# Patient Record
Sex: Female | Born: 1995 | Hispanic: Yes | Marital: Married | Smoking: Never smoker
Health system: Southern US, Community
[De-identification: ages and names within clinical notes are randomized; demographics above are authoritative.]

## PROBLEM LIST (undated history)

## (undated) DIAGNOSIS — Z789 Other specified health status: Secondary | ICD-10-CM

## (undated) HISTORY — PX: NO PAST SURGERIES: SHX2092

---

## 2014-12-04 NOTE — L&D Delivery Note (Cosign Needed)
Delivery Note At 8:28 AM a viable female was delivered via Vaginal, Spontaneous Delivery (Presentation: Left Occiput Transverse) with nuchal cord x 1 loose,occult left arm and true knot in cord.  APGAR:9 ,9 ; weight  .   Placenta status: Intact, Spontaneous.  Cord: 3 vessels with the following complications: true Knot.  Cord pH: n/a  Anesthesia: None  Episiotomy: None Lacerations: None Suture Repair: none Est. Blood Loss (mL): 300  Mom to postpartum.  Baby to Couplet care / Skin to Skin.  LAWSON, MARIE DARLENE 09/05/2015, 8:40 AM

## 2015-06-02 LAB — OB RESULTS CONSOLE GBS: GBS: NEGATIVE

## 2015-06-03 ENCOUNTER — Other Ambulatory Visit (HOSPITAL_COMMUNITY): Payer: Self-pay | Admitting: Physician Assistant

## 2015-06-03 DIAGNOSIS — O0933 Supervision of pregnancy with insufficient antenatal care, third trimester: Secondary | ICD-10-CM

## 2015-06-03 DIAGNOSIS — Z0489 Encounter for examination and observation for other specified reasons: Secondary | ICD-10-CM

## 2015-06-03 DIAGNOSIS — IMO0002 Reserved for concepts with insufficient information to code with codable children: Secondary | ICD-10-CM

## 2015-06-03 LAB — OB RESULTS CONSOLE ANTIBODY SCREEN: Antibody Screen: NEGATIVE

## 2015-06-03 LAB — OB RESULTS CONSOLE GC/CHLAMYDIA
Chlamydia: NEGATIVE
Gonorrhea: NEGATIVE

## 2015-06-03 LAB — OB RESULTS CONSOLE ABO/RH: RH Type: POSITIVE

## 2015-06-03 LAB — OB RESULTS CONSOLE HEPATITIS B SURFACE ANTIGEN: Hepatitis B Surface Ag: NEGATIVE

## 2015-06-03 LAB — OB RESULTS CONSOLE HIV ANTIBODY (ROUTINE TESTING): HIV: NONREACTIVE

## 2015-06-03 LAB — OB RESULTS CONSOLE RPR: RPR: NONREACTIVE

## 2015-06-03 LAB — OB RESULTS CONSOLE RUBELLA ANTIBODY, IGM: Rubella: IMMUNE

## 2015-06-08 ENCOUNTER — Ambulatory Visit (HOSPITAL_COMMUNITY)
Admission: RE | Admit: 2015-06-08 | Discharge: 2015-06-08 | Disposition: A | Discharge status: Home / Self Care | Payer: Self-pay | Source: Ambulatory Visit | Attending: Physician Assistant | Admitting: Physician Assistant

## 2015-06-08 DIAGNOSIS — Z3A30 30 weeks gestation of pregnancy: Secondary | ICD-10-CM | POA: Insufficient documentation

## 2015-06-08 DIAGNOSIS — O0930 Supervision of pregnancy with insufficient antenatal care, unspecified trimester: Secondary | ICD-10-CM | POA: Insufficient documentation

## 2015-06-08 DIAGNOSIS — O0933 Supervision of pregnancy with insufficient antenatal care, third trimester: Secondary | ICD-10-CM | POA: Insufficient documentation

## 2015-06-08 DIAGNOSIS — Z3A28 28 weeks gestation of pregnancy: Secondary | ICD-10-CM | POA: Insufficient documentation

## 2015-06-08 DIAGNOSIS — IMO0002 Reserved for concepts with insufficient information to code with codable children: Secondary | ICD-10-CM

## 2015-06-08 DIAGNOSIS — Z3689 Encounter for other specified antenatal screening: Secondary | ICD-10-CM | POA: Insufficient documentation

## 2015-06-08 DIAGNOSIS — Z36 Encounter for antenatal screening of mother: Secondary | ICD-10-CM | POA: Insufficient documentation

## 2015-06-08 DIAGNOSIS — Z0489 Encounter for examination and observation for other specified reasons: Secondary | ICD-10-CM

## 2015-06-15 ENCOUNTER — Other Ambulatory Visit (HOSPITAL_COMMUNITY): Payer: Self-pay | Admitting: Physician Assistant

## 2015-06-15 DIAGNOSIS — Z0489 Encounter for examination and observation for other specified reasons: Secondary | ICD-10-CM

## 2015-06-15 DIAGNOSIS — IMO0002 Reserved for concepts with insufficient information to code with codable children: Secondary | ICD-10-CM

## 2015-07-06 ENCOUNTER — Ambulatory Visit (HOSPITAL_COMMUNITY): Admission: RE | Admit: 2015-07-06 | Payer: Self-pay | Source: Ambulatory Visit

## 2015-08-31 ENCOUNTER — Other Ambulatory Visit (HOSPITAL_COMMUNITY): Payer: Self-pay | Admitting: Physician Assistant

## 2015-08-31 DIAGNOSIS — O480 Post-term pregnancy: Secondary | ICD-10-CM

## 2015-09-03 ENCOUNTER — Ambulatory Visit (HOSPITAL_COMMUNITY)
Admission: RE | Admit: 2015-09-03 | Discharge: 2015-09-03 | Disposition: A | Discharge status: Home / Self Care | Payer: Self-pay | Source: Ambulatory Visit | Attending: Physician Assistant | Admitting: Physician Assistant

## 2015-09-03 ENCOUNTER — Encounter (HOSPITAL_COMMUNITY): Payer: Self-pay | Admitting: *Deleted

## 2015-09-03 ENCOUNTER — Telehealth (HOSPITAL_COMMUNITY): Payer: Self-pay | Admitting: *Deleted

## 2015-09-03 ENCOUNTER — Other Ambulatory Visit (HOSPITAL_COMMUNITY): Payer: Self-pay | Admitting: Physician Assistant

## 2015-09-03 DIAGNOSIS — Z3A40 40 weeks gestation of pregnancy: Secondary | ICD-10-CM | POA: Insufficient documentation

## 2015-09-03 DIAGNOSIS — O0933 Supervision of pregnancy with insufficient antenatal care, third trimester: Secondary | ICD-10-CM | POA: Insufficient documentation

## 2015-09-03 DIAGNOSIS — O480 Post-term pregnancy: Secondary | ICD-10-CM

## 2015-09-03 LAB — OB RESULTS CONSOLE GBS: GBS: NEGATIVE

## 2015-09-03 NOTE — Telephone Encounter (Signed)
Preadmission screen Interpreter number [phone]

## 2015-09-05 ENCOUNTER — Encounter (HOSPITAL_COMMUNITY): Payer: Self-pay | Admitting: *Deleted

## 2015-09-05 ENCOUNTER — Inpatient Hospital Stay (HOSPITAL_COMMUNITY): Admission: RE | Admit: 2015-09-05 | Payer: No Typology Code available for payment source | Source: Ambulatory Visit

## 2015-09-05 ENCOUNTER — Inpatient Hospital Stay (HOSPITAL_COMMUNITY)
Admission: AD | Admit: 2015-09-05 | Discharge: 2015-09-06 | DRG: 775 | Disposition: A | Discharge status: Home / Self Care | Payer: Medicaid Other | Source: Ambulatory Visit | Attending: Obstetrics & Gynecology | Admitting: Obstetrics & Gynecology

## 2015-09-05 DIAGNOSIS — O480 Post-term pregnancy: Secondary | ICD-10-CM

## 2015-09-05 DIAGNOSIS — Z811 Family history of alcohol abuse and dependence: Secondary | ICD-10-CM | POA: Diagnosis not present

## 2015-09-05 DIAGNOSIS — Z3A41 41 weeks gestation of pregnancy: Secondary | ICD-10-CM

## 2015-09-05 DIAGNOSIS — IMO0001 Reserved for inherently not codable concepts without codable children: Secondary | ICD-10-CM

## 2015-09-05 HISTORY — DX: Other specified health status: Z78.9

## 2015-09-05 LAB — CBC
HCT: 33.2 % — ABNORMAL LOW (ref 36.0–46.0)
HEMOGLOBIN: 11.3 g/dL — AB (ref 12.0–15.0)
MCH: 30.1 pg (ref 26.0–34.0)
MCHC: 34 g/dL (ref 30.0–36.0)
MCV: 88.3 fL (ref 78.0–100.0)
PLATELETS: 226 10*3/uL (ref 150–400)
RBC: 3.76 MIL/uL — ABNORMAL LOW (ref 3.87–5.11)
RDW: 13.5 % (ref 11.5–15.5)
WBC: 10.2 10*3/uL (ref 4.0–10.5)

## 2015-09-05 LAB — RPR: RPR: NONREACTIVE

## 2015-09-05 LAB — TYPE AND SCREEN
ABO/RH(D): O POS
Antibody Screen: NEGATIVE

## 2015-09-05 LAB — ABO/RH: ABO/RH(D): O POS

## 2015-09-05 LAB — POCT FERN TEST: POCT Fern Test: POSITIVE

## 2015-09-05 MED ORDER — SODIUM CHLORIDE 0.9 % IJ SOLN: 3.0000 mL | INTRAMUSCULAR | Status: DC | PRN

## 2015-09-05 MED ORDER — IBUPROFEN 600 MG PO TABS
600.0000 mg | ORAL_TABLET | Freq: Four times a day (QID) | ORAL | Status: DC
  Administered 2015-09-05 – 2015-09-06 (×5): 600 mg via ORAL
  Filled 2015-09-05 (×5): qty 1

## 2015-09-05 MED ORDER — PRENATAL MULTIVITAMIN CH
1.0000 | ORAL_TABLET | Freq: Every day | ORAL | Status: DC
Start: 2015-09-05 — End: 2015-09-06
  Administered 2015-09-05 – 2015-09-06 (×2): 1 via ORAL
  Filled 2015-09-05 (×2): qty 1

## 2015-09-05 MED ORDER — ACETAMINOPHEN 325 MG PO TABS: 650.0000 mg | ORAL_TABLET | ORAL | Status: DC | PRN

## 2015-09-05 MED ORDER — ONDANSETRON HCL 4 MG/2ML IJ SOLN: 4.0000 mg | Freq: Four times a day (QID) | INTRAMUSCULAR | Status: DC | PRN

## 2015-09-05 MED ORDER — OXYTOCIN 40 UNITS IN LACTATED RINGERS INFUSION - SIMPLE MED: 62.5000 mL/h | INTRAVENOUS | Status: DC | PRN

## 2015-09-05 MED ORDER — FLEET ENEMA 7-19 GM/118ML RE ENEM: 1.0000 | ENEMA | Freq: Every day | RECTAL | Status: DC | PRN

## 2015-09-05 MED ORDER — OXYCODONE-ACETAMINOPHEN 5-325 MG PO TABS: 2.0000 | ORAL_TABLET | ORAL | Status: DC | PRN

## 2015-09-05 MED ORDER — DIBUCAINE 1 % RE OINT: 1.0000 "application " | TOPICAL_OINTMENT | RECTAL | Status: DC | PRN

## 2015-09-05 MED ORDER — OXYTOCIN BOLUS FROM INFUSION
500.0000 mL | INTRAVENOUS | Status: DC
Start: 2015-09-05 — End: 2015-09-05
  Administered 2015-09-05: 500 mL via INTRAVENOUS

## 2015-09-05 MED ORDER — SIMETHICONE 80 MG PO CHEW: 80.0000 mg | CHEWABLE_TABLET | ORAL | Status: DC | PRN

## 2015-09-05 MED ORDER — WITCH HAZEL-GLYCERIN EX PADS: 1.0000 "application " | MEDICATED_PAD | CUTANEOUS | Status: DC | PRN

## 2015-09-05 MED ORDER — LIDOCAINE HCL (PF) 1 % IJ SOLN
30.0000 mL | INTRAMUSCULAR | Status: DC | PRN
  Filled 2015-09-05: qty 30

## 2015-09-05 MED ORDER — OXYTOCIN 40 UNITS IN LACTATED RINGERS INFUSION - SIMPLE MED
62.5000 mL/h | INTRAVENOUS | Status: DC
Start: 2015-09-05 — End: 2015-09-05
  Filled 2015-09-05: qty 1000

## 2015-09-05 MED ORDER — TETANUS-DIPHTH-ACELL PERTUSSIS 5-2.5-18.5 LF-MCG/0.5 IM SUSP: 0.5000 mL | Freq: Once | INTRAMUSCULAR | Status: DC

## 2015-09-05 MED ORDER — FENTANYL CITRATE (PF) 100 MCG/2ML IJ SOLN
100.0000 ug | INTRAMUSCULAR | Status: DC | PRN
  Filled 2015-09-05: qty 2

## 2015-09-05 MED ORDER — SODIUM CHLORIDE 0.9 % IJ SOLN: 3.0000 mL | Freq: Two times a day (BID) | INTRAMUSCULAR | Status: DC

## 2015-09-05 MED ORDER — BENZOCAINE-MENTHOL 20-0.5 % EX AERO: 1.0000 "application " | INHALATION_SPRAY | CUTANEOUS | Status: DC | PRN

## 2015-09-05 MED ORDER — OXYCODONE-ACETAMINOPHEN 5-325 MG PO TABS
1.0000 | ORAL_TABLET | ORAL | Status: DC | PRN
  Administered 2015-09-05: 1 via ORAL
  Filled 2015-09-05: qty 1

## 2015-09-05 MED ORDER — LANOLIN HYDROUS EX OINT: TOPICAL_OINTMENT | CUTANEOUS | Status: DC | PRN

## 2015-09-05 MED ORDER — CITRIC ACID-SODIUM CITRATE 334-500 MG/5ML PO SOLN: 30.0000 mL | ORAL | Status: DC | PRN

## 2015-09-05 MED ORDER — SODIUM CHLORIDE 0.9 % IV SOLN: 250.0000 mL | INTRAVENOUS | Status: DC | PRN

## 2015-09-05 MED ORDER — LACTATED RINGERS IV SOLN
INTRAVENOUS | Status: DC
Start: 2015-09-05 — End: 2015-09-05
  Administered 2015-09-05: 05:00:00 via INTRAVENOUS

## 2015-09-05 MED ORDER — OXYCODONE-ACETAMINOPHEN 5-325 MG PO TABS: 1.0000 | ORAL_TABLET | ORAL | Status: DC | PRN

## 2015-09-05 MED ORDER — ONDANSETRON HCL 4 MG PO TABS: 4.0000 mg | ORAL_TABLET | ORAL | Status: DC | PRN

## 2015-09-05 MED ORDER — ZOLPIDEM TARTRATE 5 MG PO TABS
5.0000 mg | ORAL_TABLET | Freq: Every evening | ORAL | Status: DC | PRN
Start: 2015-09-05 — End: 2015-09-06

## 2015-09-05 MED ORDER — ONDANSETRON HCL 4 MG/2ML IJ SOLN: 4.0000 mg | INTRAMUSCULAR | Status: DC | PRN

## 2015-09-05 MED ORDER — LACTATED RINGERS IV SOLN: 500.0000 mL | INTRAVENOUS | Status: DC | PRN

## 2015-09-05 MED ORDER — SENNOSIDES-DOCUSATE SODIUM 8.6-50 MG PO TABS
2.0000 | ORAL_TABLET | ORAL | Status: DC
  Administered 2015-09-05: 2 via ORAL
  Filled 2015-09-05: qty 2

## 2015-09-05 MED ORDER — DIPHENHYDRAMINE HCL 25 MG PO CAPS: 25.0000 mg | ORAL_CAPSULE | Freq: Four times a day (QID) | ORAL | Status: DC | PRN

## 2015-09-05 NOTE — MAU Note (Signed)
Leaked fld one time at 0330 but none since. Contractions for 30 mins. Some bloody show

## 2015-09-05 NOTE — Progress Notes (Signed)
Checked on patients needs.  °Spanish Interpreter  °

## 2015-09-05 NOTE — Progress Notes (Signed)
Dr Wende Mott in to see pt

## 2015-09-05 NOTE — Progress Notes (Signed)
Checked on patients needs. Also ordered patients dinner °Spanish Interpreter  °

## 2015-09-05 NOTE — Lactation Note (Signed)
This note was copied from the chart of Jennifer Carah Barrientes. Lactation Consultation Note: Spanish interpreter present for visit. Experienced BF mom. Mom sleepy after delivery. Baby asleep now too. Encouraged to watch for feeding cues and feed whenever she sees them. Spanish BF brochure given with resources for support after DC. No questions at present. To call for assist prn  Patient Name: Jennifer Henson [id number] Date: 09/05/2015 Reason for consult: Initial assessment   Maternal Data Formula Feeding for Exclusion: Yes Reason for exclusion: Mother's choice to formula and breast feed on admission Does the patient have breastfeeding experience prior to this delivery?: Yes  Feeding    LATCH Score/Interventions                      Lactation Tools Discussed/Used     Consult Status Consult Status: Follow-up Date: 09/06/15 Follow-up type: In-patient    Pamelia Hoit 09/05/2015, 10:28 AM

## 2015-09-05 NOTE — Progress Notes (Signed)
Subjective:  Jennifer Henson is a 19 y.o. G2 P2 female with EDC none at 71 and 0/[redacted] weeks gestation who is being admitted for induction of labor for postdates.  Her current obstetrical history is significant for shoulder dystocia.  Patient reports fatigue.   Fetal Movement: normal.     Objective:   Vital signs in last 24 hours: Temp:  [98.1 F (36.7 C)-98.6 F (37 C)] 98.6 F (37 C) (10/02 0750) Pulse Rate:  [77-87] 77 (10/02 0530) Resp:  [16-20] 16 (10/02 0530) BP: (118-124)/(72-76) 124/76 mmHg (10/02 0530) Weight:  [83.553 kg (184 lb 3.2 oz)-83.915 kg (185 lb)] 83.915 kg (185 lb) (10/02 0552)   General:   alert, cooperative and appears stated age  Skin:   normal and no rash or abnormalities  HEENT:  PERRLA  Lungs:   clear to auscultation bilaterally  Heart:   regular rate and rhythm, S1, S2 normal, no murmur, click, rub or gallop  Breasts:   normal without suspicious masses, skin or nipple changes or axillary nodes and symmetric fibrous changes in both upper outer quadrants  Abdomen:  soft, non-tender; bowel sounds normal; no masses,  no organomegaly  Pelvis:  Exam deferred.        Presentations: cephalic                            Assessment/Plan:  41 and 0/[redacted] weeks gestation. Satisfactory labor progress, successful delivery Obstetrical history significant for shoulder dystocia .     Risks, benefits, alternatives and possible complications have been discussed in detail with the patient.  Pre-admission, admission, and post admission procedures and expectations were discussed in detail.  All questions answered, all appropriate consents will be signed at the Hospital.

## 2015-09-05 NOTE — MAU Note (Signed)
Report called to [facility] in [facility]. Pt may come to 164

## 2015-09-05 NOTE — H&P (Signed)
Chief Complaint:  Rupture of Membranes and Contractions HPI: Jennifer Henson is a 19 y.o. G2P1001 at [redacted]w[redacted]d who presents to maternity admissions reporting ROM. Patient was scheduled for induction later today but experienced a rush of fluid and came to MAU. Ferm is positive. Endorses contractions and some discomfort. No vaginal bleeding. Good fetal movement.   Pregnancy Course:  Extremely late/no prenatal care. It appears as though initial visit was ~28wks. GBS neg, RPR neg, Rubella immune, HIV neg, HepB neg, G/C neg, Antibody neg Blood Type: O positive  Past Medical History: Past Medical History  Diagnosis Date  . Medical history non-contributory     Past obstetric history: OB History  Gravida Para Term Preterm AB SAB TAB Ectopic Multiple Living  # Outcome Date GA Lbr Len/2nd Weight Sex Delivery Anes PTL Lv  2 Current           1 Term 2013 [redacted]w[redacted]d  4.082 kg (9 lb) F Vag-Spont EPI  Y      Past Surgical History: Past Surgical History  Procedure Laterality Date  . No past surgeries       Family History: Family History  Problem Relation Age of Onset  . Alcohol abuse Neg Hx     Social History: Social History  Substance Use Topics  . Smoking status: Never Smoker   . Smokeless tobacco: None  . Alcohol Use: No    Allergies: No Known Allergies  Meds:  Prescriptions prior to admission  Medication Sig Dispense Refill Last Dose  . Prenatal Vit-Fe Fumarate-FA (PRENATAL MULTIVITAMIN) TABS tablet Take 1 tablet by mouth daily at 12 noon.   Past Week at Unknown time    ROS: Pertinent findings in history of present illness.  Physical Exam  Blood pressure 118/72, pulse 87, temperature 98.1 F (36.7 C), resp. rate 20, height 5' 3.5" (1.613 m), weight 83.553 kg (184 lb 3.2 oz), last menstrual period 11/05/2014. GENERAL: Well-developed, well-nourished female in no acute distress. Strictly spanish speaking. HEENT: normocephalic HEART: normal rate RESP:  normal effort ABDOMEN: Soft, non-tender, gravid appropriate for gestational age EXTREMITIES: Nontender, no edema NEURO: alert and oriented  Dilation: 4 Effacement (%): 90 Cervical Position: Anterior Station: -1 Presentation: Vertex Exam by:: Quintella Baton RNC  FHT:  Baseline 130 , moderate variability, accelerations present, no decelerations Contractions: irregular   Labs: Results for orders placed or performed during the hospital encounter of 09/05/15 (from the past 24 hour(s))  Fern Test     Status: Normal   Collection Time: 09/05/15  4:55 AM  Result Value Ref Range   POCT Fern Test Positive = ruptured amniotic membanes   CBC     Status: Abnormal   Collection Time: 09/05/15  5:10 AM  Result Value Ref Range   WBC 10.2 4.0 - 10.5 K/uL   RBC 3.76 (L) 3.87 - 5.11 MIL/uL   Hemoglobin 11.3 (L) 12.0 - 15.0 g/dL   HCT 16.1 (L) 09.6 - 04.5 %   MCV 88.3 78.0 - 100.0 fL   MCH 30.1 26.0 - 34.0 pg   MCHC 34.0 30.0 - 36.0 g/dL   RDW 40.9 81.1 - 91.4 %   Platelets 226 150 - 400 K/uL    Imaging:  [place] Mfm Fetal Bpp Wo Non Stress  09/03/2015   OBSTETRICAL ULTRASOUND: This exam was performed within a [facility]. The OB US report was generated in the [facility] system, and faxed to  the ordering physician.   This report is available in the [employer]. See the [facility] Obstetric US report via the Image Link.  Assessment: 1. Labor: SROM; was scheduled to be induced later today for postdates 2. Fetal Wellbeing: Category 1  3. Pain Control: IV fentanyl 4. GBS: neg 5. 41 week IUP  Plan:  1. Admit to BS per consult with MD 2. Routine L&D orders 3. Analgesia/anesthesia PRN      Medication List    ASK your doctor about these medications        prenatal multivitamin Tabs tablet  Take 1 tablet by mouth daily at 12 noon.        Kathee Delton, MD 09/05/2015 5:28 AM   I spoke with and examined patient and agree with resident/PA/SNM's note and plan of care.  Cheral Marker,  CNM, [facility] 09/05/2015 8:52 AM

## 2015-09-05 NOTE — Progress Notes (Signed)
Dr Wende Mott notified of pt's admission and status. Aware of SROM with cl fld, FHR with early decels, sve, ctx pattern. Will see pt and admit to [facility]

## 2015-09-06 MED ORDER — IBUPROFEN 600 MG PO TABS: 600.0000 mg | ORAL_TABLET | Freq: Four times a day (QID) | ORAL | Status: AC

## 2015-09-06 NOTE — Clinical Social Work Maternal (Signed)
  CLINICAL SOCIAL WORK MATERNAL/CHILD NOTE  Patient Details  Name: Jennifer Henson MRN: [id number] Date of Birth: [birth year]  Date:  09/06/2015  Clinical Social Worker Initiating Note:  Loleta Books MSW, LCSW Date/ Time Initiated:  09/06/15/0945     Child's Name:  Jennifer Henson    Legal Guardian:  [name] [place] and Ramonita Lab  Need for Interpreter:  Spanish   Date of Referral:  09/05/15     Reason for Referral:  Late or No Prenatal Care    Referral Source:  [facility]   Address:  [address] [place], [place] [address]  Phone number:  [phone]   Household Members:  Spouse, Minor Children   Natural Supports (not living in the home):  Immediate Family   Professional Supports: None   Financial Resources:  Self-Pay    Other Resources:    None identified.  Cultural/Religious Considerations Which May Impact Care:  None reported  Strengths:  Home prepared for child , Pediatrician chosen , Ability to meet basic needs    Risk Factors/Current Problems:  1) Late prenatal care: MOB initiated care at [redacted]w[redacted]d. MOB denied substance use during the pregnancy.  Infant's UDS is negative and MDS is pending.   Cognitive State:  Alert , Goal Oriented , Able to Concentrate , Linear Thinking    Mood/Affect:  Comfortable , Calm , Interested    CSW Assessment:  CSW received request for consult due to MOB presenting late to prenatal fare at [redacted]w[redacted]d.  Assessment completed with assistance Eda Royal, hospital Spanish interpreter.  MOB was lying down in the bed caring for the infant during the assessment. FOB was also present in the room, who was observed to be attentive during CSW visit.  MOB was quiet and displayed a limited range in affect; however, she was noted to be smiling as she cared for the infant.  MOB denied questions, concerns, or needs as she transitions postpartum. MOB endorsed presence of support system, and shared that she has the home prepared for the  infant. MOB denied concerns about transitioning to caring for two children.  MOB denied prior mental health history, and denied history of perinatal mood disorders. MOB presented as attentive as CSW provided education on perinatal mood disorders, and agreed to contact her medical provider if she notes onset of symptoms.    CSW inquired about events that led to late prenatal care. Per MOB, she did not not know that she was pregnant. MOB denied additional barriers to accessing care. She stated that she has access to transportation, and denied presence of any barriers to care postpartum.  MOB verbalized understanding of hospital drug screen policy, and denied any substance use during the pregnancy.   MOB and FOB denied questions, concerns, or needs, but agreed to contact CSW if needs arise.   CSW Plan/Description:   1)Patient/Family Education: Perinatal mood disorders, hospital drug screen policy 2) CSW to monitor infant's toxicology screens and will make a CPS report if positive.  3)No Further Intervention Required/No Barriers to Discharge    Kelby Fam 09/06/2015, 12:50 PM

## 2015-09-06 NOTE — Discharge Summary (Signed)
OB Discharge Summary  Patient Name: Jennifer Henson DOB: [birth year] MRN: [id number]  Date of admission: 09/05/2015 Delivering MD: Zerita Boers   Date of discharge: 09/06/2015  Admitting diagnosis: 41wks, CTX, [place] Intrauterine pregnancy: [redacted]w[redacted]d     Secondary diagnosis: None     Discharge diagnosis: Term Pregnancy Delivered                                                                                                Post partum procedures:none  Augmentation: none  Complications: None  Hospital course:  Onset of Labor With Vaginal Delivery     19 y.o. yo [id number] at [redacted]w[redacted]d was admitted in Active Laboron 09/05/2015. Patient had an uncomplicated labor course as follows:  Membrane Rupture Time/Date: 3:30 AM ,09/05/2015   Intrapartum Procedures: Episiotomy: None [1]                                         Lacerations:  None [1]  Patient had a delivery of a Viable infant. 09/05/2015  Information for the patient's newborn:  Jennifer Henson, Boy Jennifer Henson [[id number]]  Delivery Method: Vaginal, Spontaneous Delivery (Filed from Delivery Summary)    Pateint had an uncomplicated postpartum course.  She is ambulating, tolerating a regular diet, passing flatus, and urinating well. Patient is discharged home in stable condition on No discharge date for patient encounter.Marland Kitchen    Physical exam  Filed Vitals:   09/05/15 1030 09/05/15 1130 09/05/15 1530 09/06/15 0617  BP: 94/50  Pulse: 73 90 74 65  Temp: 97.8 F (36.6 C) 98.7 F (37.1 C) 98.2 F (36.8 C) 97.9 F (36.6 C)  TempSrc: Oral Oral Oral Oral  Resp: Height:      Weight:       General: alert, cooperative and no distress Lochia: appropriate Uterine Fundus: firm Incision: N/A DVT Evaluation: No evidence of DVT seen on physical exam. Negative Homan's sign. No cords or calf tenderness. Labs: Lab Results  Component Value Date   WBC 10.2 09/05/2015   HGB 11.3* 09/05/2015   HCT  33.2* 09/05/2015   MCV 88.3 09/05/2015   PLT 226 09/05/2015   No flowsheet data found.  Discharge instruction: per After Visit Summary and "Baby and Me Booklet".  Medications:  Current facility-administered medications:  .  sodium chloride 0.9 % injection 3 mL, 3 mL, Intravenous, Q12H **AND** sodium chloride 0.9 % injection 3 mL, 3 mL, Intravenous, PRN **AND** 0.9 %  sodium chloride infusion, 250 mL, Intravenous, PRN, Montez Morita, CNM .  acetaminophen (TYLENOL) tablet 650 mg, 650 mg, Oral, Q4H PRN, Montez Morita, CNM .  benzocaine-Menthol (DERMOPLAST) 20-0.5 % topical spray 1 application, 1 application, Topical, PRN, Montez Morita, CNM .  witch hazel-glycerin (TUCKS) pad 1 application, 1 application, Topical, PRN **AND** dibucaine (NUPERCAINAL) 1 % rectal ointment 1 application, 1 application, Rectal, PRN, Montez Morita, CNM .  diphenhydrAMINE (BENADRYL) capsule 25 mg, 25 mg, Oral,  Q6H PRN, Montez Morita, CNM .  ibuprofen (ADVIL,MOTRIN) tablet 600 mg, 600 mg, Oral, 4 times per day, Montez Morita, CNM, 600 mg at 09/06/15 1610 .  lanolin ointment, , Topical, PRN, Montez Morita, CNM .  ondansetron (ZOFRAN) tablet 4 mg, 4 mg, Oral, Q4H PRN **OR** ondansetron (ZOFRAN) injection 4 mg, 4 mg, Intravenous, Q4H PRN, Montez Morita, CNM .  oxyCODONE-acetaminophen (PERCOCET/ROXICET) 5-325 MG per tablet 1 tablet, 1 tablet, Oral, Q4H PRN, Montez Morita, CNM .  oxyCODONE-acetaminophen (PERCOCET/ROXICET) 5-325 MG per tablet 2 tablet, 2 tablet, Oral, Q4H PRN, Montez Morita, CNM .  oxytocin (PITOCIN) IV infusion 40 units in LR 1000 mL, 62.5 mL/hr, Intravenous, Continuous PRN, Montez Morita, CNM .  prenatal multivitamin tablet 1 tablet, 1 tablet, Oral, Q1200, Montez Morita, CNM, 1 tablet at 09/05/15 1148 .  senna-docusate (Senokot-S) tablet 2 tablet, 2 tablet, Oral, Q24H, Montez Morita, CNM, 2 tablet at 09/05/15 2348 .  simethicone (MYLICON) chewable tablet 80 mg, 80 mg, Oral, PRN, Montez Morita,  CNM .  Tdap (BOOSTRIX) injection 0.5 mL, 0.5 mL, Intramuscular, Once, Montez Morita, CNM .  zolpidem (AMBIEN) tablet 5 mg, 5 mg, Oral, QHS PRN, Montez Morita, CNM  Diet: routine diet  Activity: Advance as tolerated. Pelvic rest for 6 weeks.   Outpatient follow up:6 weeks  Postpartum contraception: Condoms  Newborn Data: Live born female  Birth Weight: 8 lb 7.8 oz (3850 g) APGAR: 9, 9  Baby Feeding: Breast Disposition:home with mother   09/06/2015 Ferdie Ping, CNM

## 2017-04-12 ENCOUNTER — Encounter (HOSPITAL_COMMUNITY): Payer: Self-pay | Admitting: Emergency Medicine

## 2017-04-12 DIAGNOSIS — K2970 Gastritis, unspecified, without bleeding: Secondary | ICD-10-CM | POA: Insufficient documentation

## 2017-04-12 DIAGNOSIS — D649 Anemia, unspecified: Secondary | ICD-10-CM | POA: Insufficient documentation

## 2017-04-12 NOTE — ED Triage Notes (Signed)
c/o pain in right upper quad on and off for a week.  Reports it has been going away but not today.  Denies any n/v/d.

## 2017-04-13 ENCOUNTER — Emergency Department (HOSPITAL_COMMUNITY): Payer: Self-pay

## 2017-04-13 ENCOUNTER — Emergency Department (HOSPITAL_COMMUNITY)
Admission: EM | Admit: 2017-04-13 | Discharge: 2017-04-13 | Disposition: A | Discharge status: Home / Self Care | Payer: Self-pay | Attending: Emergency Medicine | Admitting: Emergency Medicine

## 2017-04-13 DIAGNOSIS — D649 Anemia, unspecified: Secondary | ICD-10-CM

## 2017-04-13 DIAGNOSIS — R1011 Right upper quadrant pain: Secondary | ICD-10-CM

## 2017-04-13 DIAGNOSIS — K2970 Gastritis, unspecified, without bleeding: Secondary | ICD-10-CM

## 2017-04-13 LAB — URINALYSIS, ROUTINE W REFLEX MICROSCOPIC
Bilirubin Urine: NEGATIVE
GLUCOSE, UA: NEGATIVE mg/dL
Hgb urine dipstick: NEGATIVE
KETONES UR: NEGATIVE mg/dL
NITRITE: NEGATIVE
PH: 7 (ref 5.0–8.0)
Protein, ur: NEGATIVE mg/dL
Specific Gravity, Urine: 1.009 (ref 1.005–1.030)

## 2017-04-13 LAB — COMPREHENSIVE METABOLIC PANEL
ALT: 11 U/L — AB (ref 14–54)
AST: 16 U/L (ref 15–41)
Albumin: 3.8 g/dL (ref 3.5–5.0)
Alkaline Phosphatase: 88 U/L (ref 38–126)
Anion gap: 8 (ref 5–15)
BILIRUBIN TOTAL: 0.1 mg/dL — AB (ref 0.3–1.2)
BUN: 11 mg/dL (ref 6–20)
CALCIUM: 9.1 mg/dL (ref 8.9–10.3)
CO2: 26 mmol/L (ref 22–32)
CREATININE: 0.58 mg/dL (ref 0.44–1.00)
Chloride: 103 mmol/L (ref 101–111)
Glucose, Bld: 99 mg/dL (ref 65–99)
Potassium: 3.7 mmol/L (ref 3.5–5.1)
SODIUM: 137 mmol/L (ref 135–145)
TOTAL PROTEIN: 7.1 g/dL (ref 6.5–8.1)

## 2017-04-13 LAB — POC URINE PREG, ED: Preg Test, Ur: NEGATIVE

## 2017-04-13 LAB — CBC
HCT: 32.7 % — ABNORMAL LOW (ref 36.0–46.0)
Hemoglobin: 10.6 g/dL — ABNORMAL LOW (ref 12.0–15.0)
MCH: 27.7 pg (ref 26.0–34.0)
MCHC: 32.4 g/dL (ref 30.0–36.0)
MCV: 85.6 fL (ref 78.0–100.0)
PLATELETS: 303 10*3/uL (ref 150–400)
RBC: 3.82 MIL/uL — AB (ref 3.87–5.11)
RDW: 12.7 % (ref 11.5–15.5)
WBC: 7.5 10*3/uL (ref 4.0–10.5)

## 2017-04-13 LAB — LIPASE, BLOOD: Lipase: 23 U/L (ref 11–51)

## 2017-04-13 MED ORDER — FAMOTIDINE IN NACL 20-0.9 MG/50ML-% IV SOLN
20.0000 mg | Freq: Once | INTRAVENOUS | Status: AC
  Administered 2017-04-13: 20 mg via INTRAVENOUS
  Filled 2017-04-13: qty 50

## 2017-04-13 MED ORDER — MORPHINE SULFATE (PF) 4 MG/ML IV SOLN
4.0000 mg | Freq: Once | INTRAVENOUS | Status: AC
  Administered 2017-04-13: 4 mg via INTRAVENOUS
  Filled 2017-04-13: qty 1

## 2017-04-13 MED ORDER — SODIUM CHLORIDE 0.9 % IV BOLUS (SEPSIS)
1000.0000 mL | Freq: Once | INTRAVENOUS | Status: AC
  Administered 2017-04-13: 1000 mL via INTRAVENOUS

## 2017-04-13 MED ORDER — RANITIDINE HCL 150 MG PO TABS: 150.0000 mg | ORAL_TABLET | Freq: Two times a day (BID) | ORAL | 0 refills | Status: AC

## 2017-04-13 NOTE — ED Notes (Addendum)
Pt presents with upper abd pains just under her ribs. Pt denies N/V/D.

## 2017-04-13 NOTE — Discharge Instructions (Addendum)
Your abdominal pain is likely from gastritis or an ulcer, or could be from gallbladder dysfunction. You will need to take zantac as directed, and avoid spicy/fatty/acidic foods, avoid soda/coffee/tea/alcohol. Avoid laying down flat within 30 minutes of eating. Avoid NSAIDs like ibuprofen/aleve/motrin/etc on an empty stomach. May consider using over the counter tums/maalox as needed for additional relief. Use tylenol as needed for pain. May consider using heat to the area of pain, in case it's due to a muscle strain. Follow up with the [place] [facility] in one week for recheck of your symptoms and to establish primary care. Return to the ER for changes or worsening symptoms.  Abdominal (belly) pain can be caused by many things. Your caregiver performed an examination and possibly ordered blood/urine tests and imaging (CT scan, x-rays, ultrasound). Many cases can be observed and treated at home after initial evaluation in the emergency department. Even though you are being discharged home, abdominal pain can be unpredictable. Therefore, you need a repeated exam if your pain does not resolve, returns, or worsens. Most patients with abdominal pain don't have to be admitted to the hospital or have surgery, but serious problems like appendicitis and gallbladder attacks can start out as nonspecific pain. Many abdominal conditions cannot be diagnosed in one visit, so follow-up evaluations are very important. SEEK IMMEDIATE MEDICAL ATTENTION IF YOU DEVELOP ANY OF THE FOLLOWING SYMPTOMS: The pain does not go away or becomes severe.  A temperature above 101 develops.  Repeated vomiting occurs (multiple episodes).  The pain becomes localized to portions of the abdomen. The right side could possibly be appendicitis. In an adult, the left lower portion of the abdomen could be colitis or diverticulitis.  Blood is being passed in stools or vomit (bright red or black tarry stools).  Return also if you develop  chest pain, difficulty breathing, dizziness or fainting, or become confused, poorly responsive, or inconsolable (young children). The constipation stays for more than 4 days.  There is belly (abdominal) or rectal pain.  You do not seem to be getting better.

## 2017-04-13 NOTE — ED Provider Notes (Signed)
[facility]-EMERGENCY DEPT Provider Note   CSN: [id number] Arrival date & time: 04/12/17  2243     History   Chief Complaint Chief Complaint  Patient presents with  . Abdominal Pain    HPI Jennifer Henson is a 21 y.o. Otherwise healthy female, who presents to the ED with complaints of intermittent right upper quadrant pain that worsened today and became more constant. She describes the pain as 7/10 constant tightness in the RUQ, nonradiating, worse with movement or laughing/stretching, and mildly improved with Tylenol. She has never had something like this before last week when it started, and it's much worse today. She denies fevers, chills, CP, SOB, nausea/vomiting, diarrhea/constipation, obstipation, melena, hematochezia, hematuria, dysuria, vaginal bleeding/discharge, myalgias, arthralgias, numbness, tingling, focal weakness, or any other complaints at this time. Denies recent travel, sick contacts, suspicious food intake, EtOH use, NSAID use, or prior abd surgeries.    The history is provided by the patient and medical records. A language interpreter was used (provider).  Abdominal Pain   This is a new problem. The current episode started more than 2 days ago. The problem occurs constantly. The problem has been gradually worsening. The pain is associated with an unknown factor. The pain is located in the RUQ. Quality: tightness. The pain is at a severity of 7/10. The pain is moderate. Pertinent negatives include fever, diarrhea, flatus, hematochezia, melena, nausea, vomiting, constipation, dysuria, hematuria, arthralgias and myalgias. The symptoms are aggravated by certain positions and activity. The symptoms are relieved by acetaminophen.    Past Medical History:  Diagnosis Date  . Medical history non-contributory     Patient Active Problem List   Diagnosis Date Noted  . Active labor 09/05/2015  . Late prenatal care   . Encounter for fetal anatomic survey   . [redacted] weeks  gestation of pregnancy     Past Surgical History:  Procedure Laterality Date  . NO PAST SURGERIES      OB History    Gravida Para Term Preterm AB Living   2 2 2     2    SAB TAB Ectopic Multiple Live Births         0 2       Home Medications    Prior to Admission medications   Medication Sig Start Date End Date Taking? Authorizing Provider  ibuprofen (ADVIL,MOTRIN) 600 MG tablet Take 1 tablet (600 mg total) by mouth every 6 (six) hours. 09/06/15   Montez MoritaLawson, Marie D, CNM  Prenatal Vit-Fe Fumarate-FA (PRENATAL MULTIVITAMIN) TABS tablet Take 1 tablet by mouth daily at 12 noon.    [provider]    Family History Family History  Problem Relation Age of Onset  . Alcohol abuse Neg Hx     Social History Social History  Substance Use Topics  . Smoking status: Never Smoker  . Smokeless tobacco: Never Used  . Alcohol use No     Allergies   Patient has no known allergies.   Review of Systems Review of Systems  Constitutional: Negative for chills and fever.  Respiratory: Negative for shortness of breath.   Cardiovascular: Negative for chest pain.  Gastrointestinal: Positive for abdominal pain. Negative for blood in stool, constipation, diarrhea, flatus, hematochezia, melena, nausea and vomiting.  Genitourinary: Negative for dysuria, hematuria, vaginal bleeding and vaginal discharge.  Musculoskeletal: Negative for arthralgias and myalgias.  Skin: Negative for color change.  Allergic/Immunologic: Negative for immunocompromised state.  Neurological: Negative for weakness and numbness.  Psychiatric/Behavioral: Negative for confusion.  All other systems reviewed and are negative for acute change except as noted in the HPI.    Physical Exam Updated Vital Signs BP 114/73 (BP Location: Left Arm)   Pulse 77   Temp 97.7 F (36.5 C) (Oral)   Resp 18   Ht 5' (1.524 m)   Wt 59.9 kg   LMP 04/03/2017 (Approximate)   SpO2 100%   BMI 25.78 kg/m   Physical Exam    Constitutional: She is oriented to person, place, and time. Vital signs are normal. She appears well-developed and well-nourished.  Non-toxic appearance. No distress.  Afebrile, nontoxic, NAD  HENT:  Head: Normocephalic and atraumatic.  Mouth/Throat: Oropharynx is clear and moist and mucous membranes are normal.  Eyes: Conjunctivae and EOM are normal. Right eye exhibits no discharge. Left eye exhibits no discharge.  Neck: Normal range of motion. Neck supple.  Cardiovascular: Normal rate, regular rhythm, normal heart sounds and intact distal pulses.  Exam reveals no gallop and no friction rub.   No murmur heard. Pulmonary/Chest: Effort normal and breath sounds normal. No respiratory distress. She has no decreased breath sounds. She has no wheezes. She has no rhonchi. She has no rales.  Abdominal: Soft. Normal appearance and bowel sounds are normal. She exhibits no distension. There is tenderness in the right upper quadrant and epigastric area. There is positive Murphy's sign. There is no rigidity, no rebound, no guarding, no CVA tenderness and no tenderness at McBurney's point.  Soft, nondistended, +BS throughout, with moderate RUQ and epigastric TTP, no r/g/r, +murphy's, neg mcburney's, no CVA TTP   Musculoskeletal: Normal range of motion.  Neurological: She is alert and oriented to person, place, and time. She has normal strength. No sensory deficit.  Skin: Skin is warm, dry and intact. No rash noted.  Psychiatric: She has a normal mood and affect.  Nursing note and vitals reviewed.    ED Treatments / Results  Labs (all labs ordered are listed, but only abnormal results are displayed) Labs Reviewed  COMPREHENSIVE METABOLIC PANEL - Abnormal; Notable for the following:       Result Value   ALT 11 (*)    Total Bilirubin 0.1 (*)    All other components within normal limits  CBC - Abnormal; Notable for the following:    RBC 3.82 (*)    Hemoglobin 10.6 (*)    HCT 32.7 (*)    All other  components within normal limits  URINALYSIS, ROUTINE W REFLEX MICROSCOPIC - Abnormal; Notable for the following:    Color, Urine STRAW (*)    APPearance HAZY (*)    Leukocytes, UA SMALL (*)    Bacteria, UA RARE (*)    Squamous Epithelial / LPF 0-5 (*)    All other components within normal limits  LIPASE, BLOOD  POC URINE PREG, ED    EKG  EKG Interpretation None       Radiology US Abdomen Limited Ruq  Result Date: 04/13/2017 CLINICAL DATA:  Right upper quadrant abdominal pain EXAM: US ABDOMEN LIMITED - RIGHT UPPER QUADRANT COMPARISON:  None. FINDINGS: Gallbladder: No gallstones or wall thickening visualized. No sonographic Murphy sign noted by sonographer. Common bile duct: Diameter: 3 mm, normal Liver: No focal lesion identified. Within normal limits in parenchymal echogenicity. IMPRESSION: Normal examination. Electronically Signed   By: Burman Nieves M.D.   On: 04/13/2017 02:45    Procedures Procedures (including critical care time)  Medications Ordered in ED Medications  morphine 4 MG/ML injection 4 mg (4 mg  Intravenous Given 04/13/17 0242)  famotidine (PEPCID) IVPB 20 mg premix (0 mg Intravenous Stopped 04/13/17 0310)  sodium chloride 0.9 % bolus 1,000 mL (1,000 mLs Intravenous New Bag/Given 04/13/17 0243)     Initial Impression / Assessment and Plan / ED Course  I have reviewed the triage vital signs and the nursing notes.  Pertinent labs & imaging results that were available during my care of the patient were reviewed by me and considered in my medical decision making (see chart for details).     21 y.o. female here with RUQ pain x1 wk intermittent but constant today. On exam, moderate RUQ and epigastric TTP, +murphys. Upreg neg, U/A without evidence of infection, lipase WNL, CMP WNL, CBC with chronic anemia at baseline. Will get U/S to assess gallbladder. Will give pain meds, pepcid, and fluids. Will reassess shortly  5:03 AM U/S without any abnormal findings. Pt  feeling better. Could be biliary dyskinesia vs gastritis vs musculoskeletal, etc. Will start on zantac and advised use of tylenol/tums/maalox for pain relief, diet and lifestyle modifications advised, sparing use of NSAIDs advised; use of heat to the area as well advised. F/up with [facility] in 1wk for recheck of symptoms and establish care. I explained the diagnosis and have given explicit precautions to return to the ER including for any other new or worsening symptoms. The patient understands and accepts the medical plan as it's been dictated and I have answered their questions. Discharge instructions concerning home care and prescriptions have been given. The patient is STABLE and is discharged to home in good condition.    Final Clinical Impressions(s) / ED Diagnoses   Final diagnoses:  RUQ abdominal pain  Chronic anemia  Gastritis, presence of bleeding unspecified, unspecified chronicity, unspecified gastritis type    New Prescriptions New Prescriptions   RANITIDINE (ZANTAC) 150 MG TABLET    Take 1 tablet (150 mg total) by mouth 2 (two) times daily.     [address] [employer], [place] 04/13/17 6962    Azalia Bilis, MD 04/13/17 [phone]

## 2017-04-13 NOTE — ED Notes (Signed)
Patient transported to Ultrasound 

## 2017-04-16 ENCOUNTER — Ambulatory Visit: Payer: Self-pay | Attending: Internal Medicine | Admitting: Physician Assistant

## 2017-04-16 VITALS — BP 106/67 | HR 98 | Temp 98.6°F | Resp 16 | Wt 125.6 lb

## 2017-04-16 DIAGNOSIS — Z79899 Other long term (current) drug therapy: Secondary | ICD-10-CM | POA: Insufficient documentation

## 2017-04-16 DIAGNOSIS — R1011 Right upper quadrant pain: Secondary | ICD-10-CM | POA: Insufficient documentation

## 2017-04-16 NOTE — Progress Notes (Signed)
Shane CrutchMaria Torres Preston-Pot[place] HollowSalvador  WUJ:[id number]  NFA:[id number]  DOB - 07-Feb-1996  Chief Complaint  Patient presents with  . Follow-up  . Abdominal Pain       Subjective:   Jennifer MacleodMaria Torres Henson is a 21 y.o. female here today for establishment of care. She has no significant PMHx. She was seen in the ED on 04/13/17 for abdominal pain. RUQ. Constant. 2-3 days. No radiation. No N/V, no diarrhea or constipation. No blood in stools. Nl menses.   ED workup=labs ok, preg test negative, U/A-nl, US abdomen ok. Treated with pain meds, Pepcid and IVFs with improvement.  Sent home with Zantac and told to f/u here. Did get the scripts filled and feel much better. No abdominal pain.    ROS: GEN: denies fever or chills, denies change in weight Skin: denies lesions or rashes HEENT: denies headache, earache, epistaxis, sore throat, or neck pain LUNGS: denies SHOB, dyspnea, PND, orthopnea CV: denies CP or palpitations ABD: denies abd pain, N or V EXT: denies muscle spasms or swelling; no pain in lower ext, no weakness NEURO: denies numbness or tingling, denies sz, stroke or TIA   ALLERGIES: No Known Allergies  PAST MEDICAL HISTORY: Past Medical History:  Diagnosis Date  . Medical history non-contributory     PAST SURGICAL HISTORY: Past Surgical History:  Procedure Laterality Date  . NO PAST SURGERIES      MEDICATIONS AT HOME: Prior to Admission medications   Medication Sig Start Date End Date Taking? Authorizing Provider  ibuprofen (ADVIL,MOTRIN) 600 MG tablet Take 1 tablet (600 mg total) by mouth every 6 (six) hours. 09/06/15  Yes Montez MoritaLawson, Marie D, CNM  Prenatal Vit-Fe Fumarate-FA (PRENATAL MULTIVITAMIN) TABS tablet Take 1 tablet by mouth daily at 12 noon.    [provider]  ranitidine (ZANTAC) 150 MG tablet Take 1 tablet (150 mg total) by mouth 2 (two) times daily. Patient not taking: Reported on 04/16/2017 04/13/17   Street, [place] Hills[place], PA-C    Family History  Problem Relation  Age of Onset  . Alcohol abuse Neg Hx     Social-married, child, nonsmoker  Objective:   Vitals:   04/16/17 1357  BP: 106/67  Pulse: 98  Resp: 16  Temp: 98.6 F (37 C)  TempSrc: Oral  SpO2: 97%  Weight: 125 lb 9.6 oz (57 kg)    Exam General appearance : Awake, alert, not in any distress. Speech Clear. Not toxic looking HEENT: Atraumatic and Normocephalic, pupils equally reactive to light and accomodation Neck: supple, no JVD. No cervical lymphadenopathy.  Chest:Good air entry bilaterally, no added sounds  CVS: S1 S2 regular, no murmurs.  Abdomen: Bowel sounds present, Nmildly tender in RUQ and not distended with no guarding, rigidity or rebound. Extremities: B/L Lower Ext shows no edema, both legs are warm to touch Neurology: Awake alert, and oriented X 3, CN II-XII intact, Non focal Skin:No Rash Wounds:N/A   Assessment & Plan  1. RUQ abdominal pain, likely gastritis-improved  -Cont Zantac  -prn antiinflammatories  -avoid triggers   Return in about 3 weeks (around 05/07/2017).  The patient was given clear instructions to go to ER or return to medical center if symptoms don't improve, worsen or new problems develop. The patient verbalized understanding. The patient was told to call to get lab results if they haven't heard anything in the next week.   Total time spent with patient was 18 min. Greater than 50 % of this visit was spent face to face counseling and coordinating care  regarding risk factor modification, compliance importance and encouragement, education related to routine health maintenance.  This note has been created with Education officer, environmental. Any transcriptional errors are unintentional.    Scot Jun, PA-C [facility] and [facility] [place], [place] [phone]   04/16/2017, 2:18 PM

## 2017-12-05 IMAGING — US US ABDOMEN LIMITED
1 series · 14 of 25 positions shown · non-contrast
Comparison: None.

CLINICAL DATA: Right upper quadrant abdominal pain

EXAM:
US ABDOMEN LIMITED - RIGHT UPPER QUADRANT

[Series 1: us abdomen limited · 0.19mm/px · 14 of 43 slices shown]
[im 1/43]
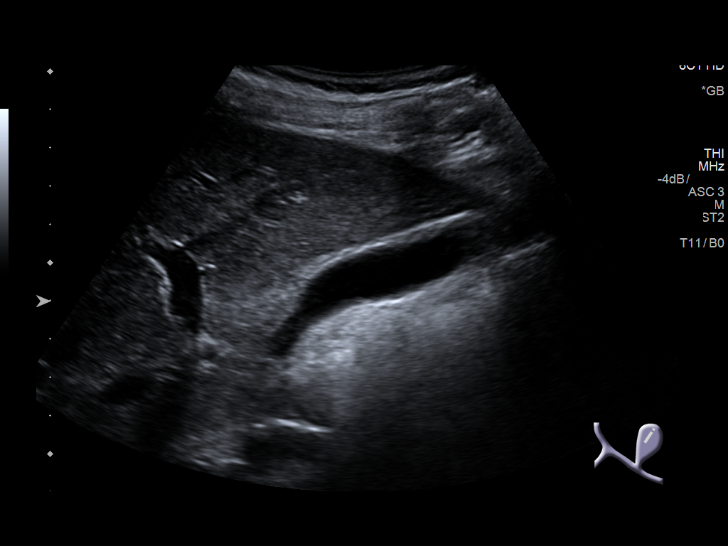
[im 4/43]
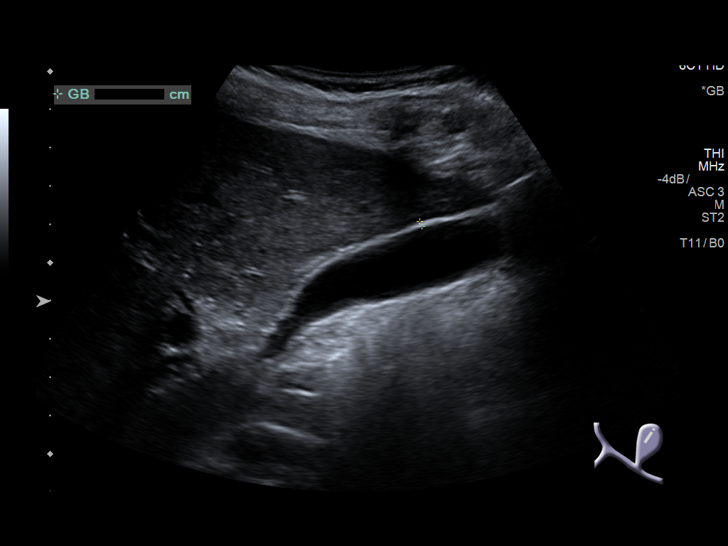
[im 8/43]
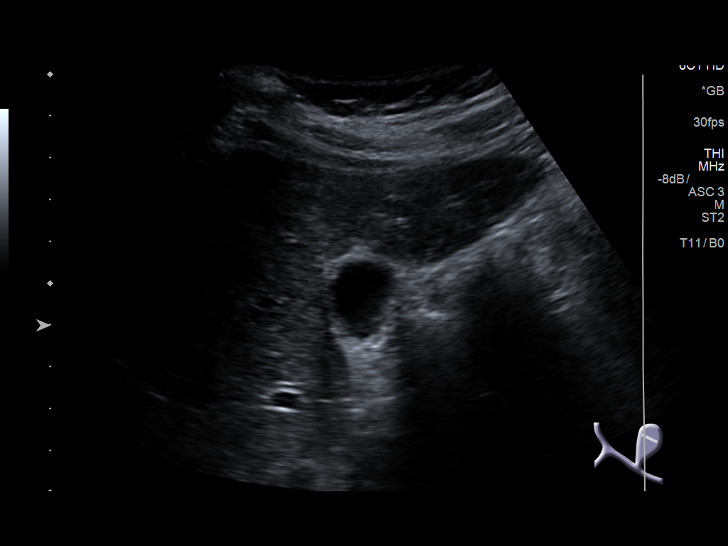
[im 11/43]
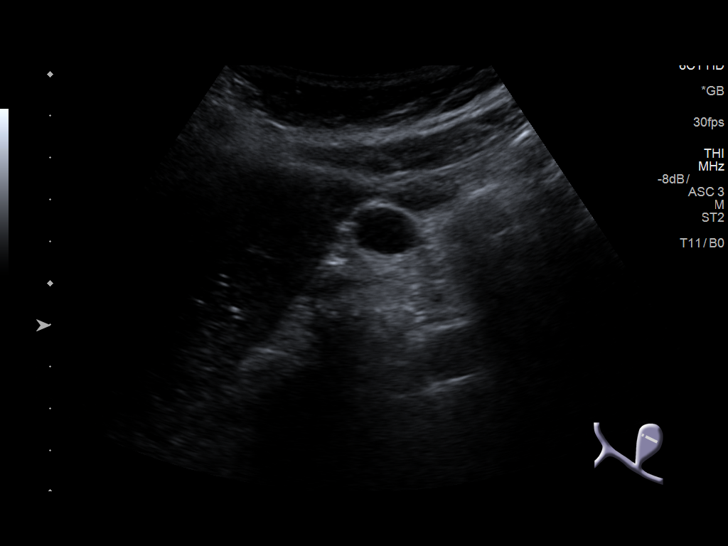
[im 15/43]
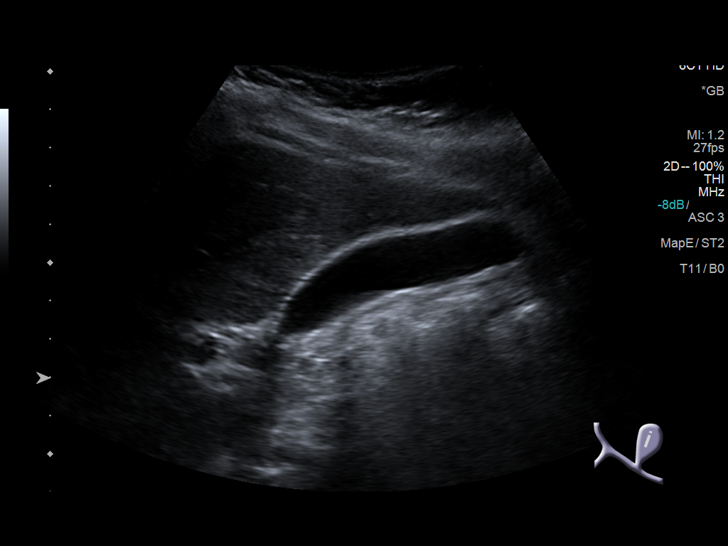
[im 16/43]
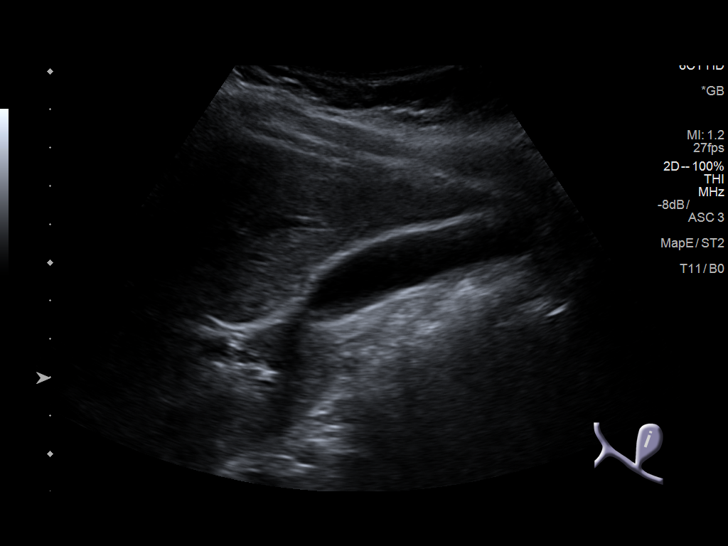
[im 20/43]
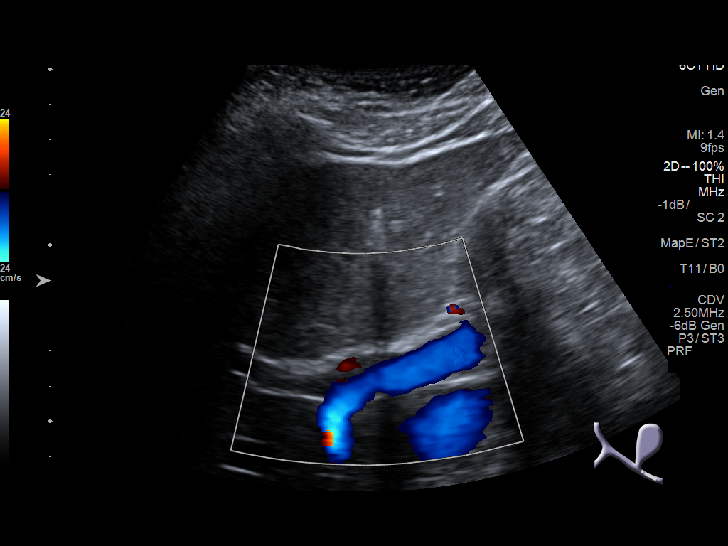
[im 23/43]
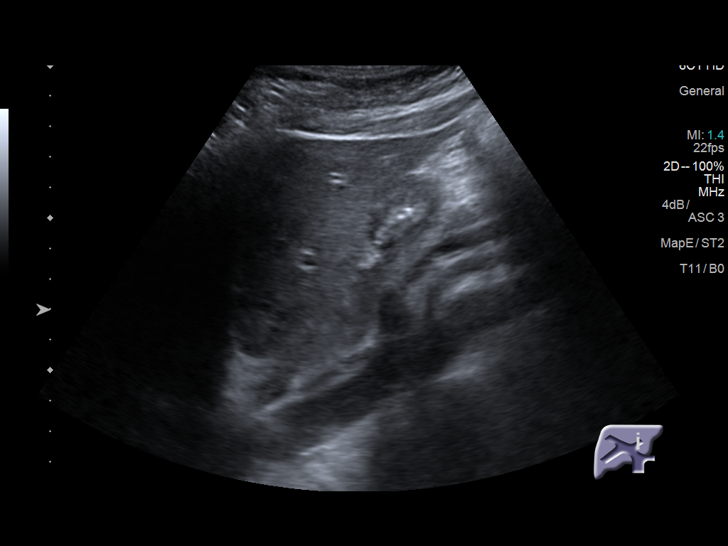
[im 27/43]
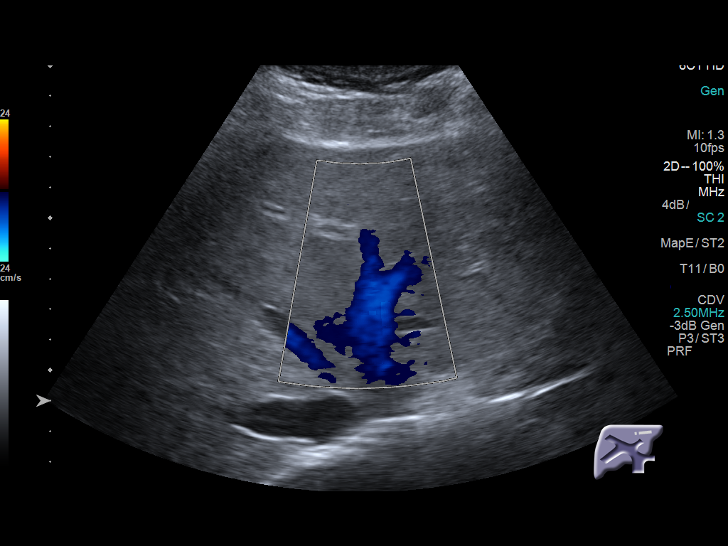
[im 29/43]
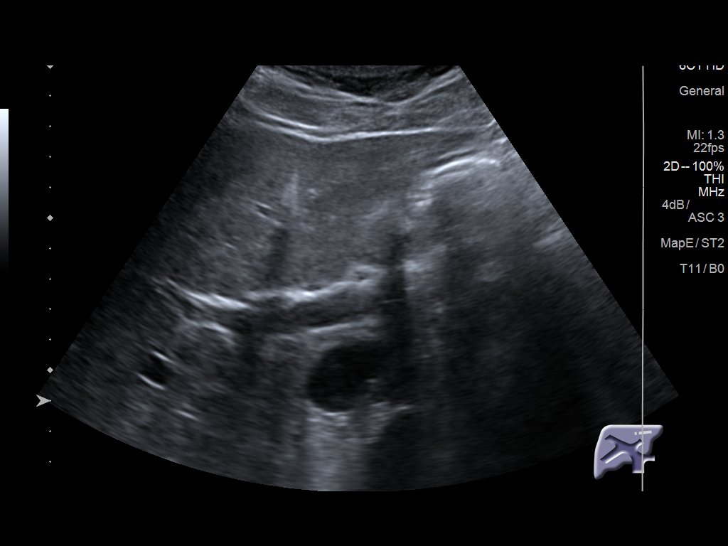
[im 32/43]
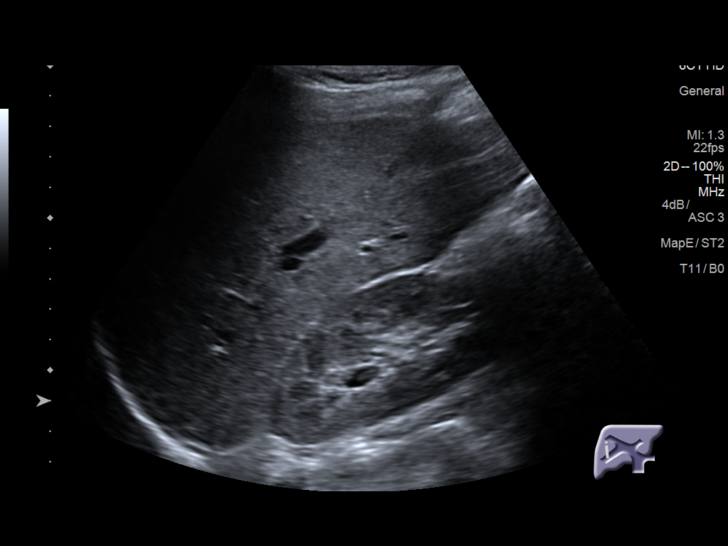
[im 36/43]
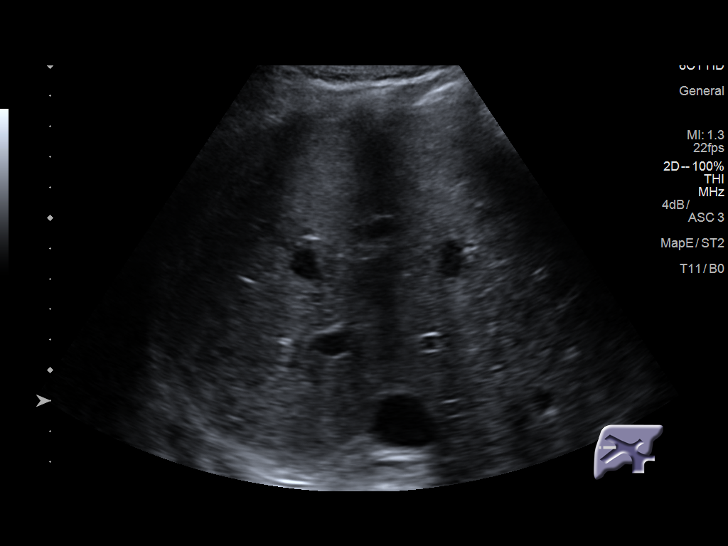
[im 39/43]
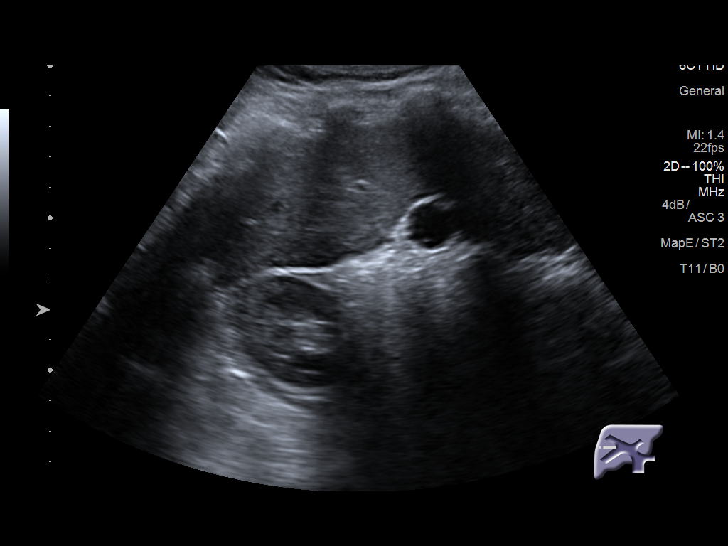
[im 43/43]
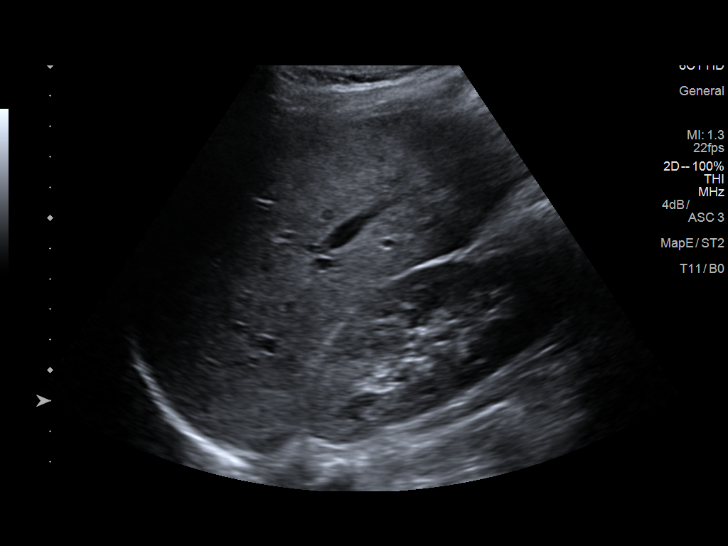

[14 of 25 positions shown; findings below may reference images not displayed]

FINDINGS: Gallbladder:

No gallstones or wall thickening visualized. No sonographic Murphy
sign noted by sonographer.

Common bile duct:

Diameter: 3 mm, normal

Liver:

No focal lesion identified. Within normal limits in parenchymal
echogenicity.
IMPRESSION: Normal examination.

## 2023-07-11 ENCOUNTER — Ambulatory Visit: Payer: Self-pay | Admitting: Obstetrics and Gynecology
